# Patient Record
Sex: Male | Born: 1965 | Race: White | Hispanic: No | Smoking: Never smoker
Health system: Southern US, Community
[De-identification: ages and names within clinical notes are randomized; demographics above are authoritative.]

## PROBLEM LIST (undated history)

## (undated) DIAGNOSIS — E785 Hyperlipidemia, unspecified: Secondary | ICD-10-CM

## (undated) DIAGNOSIS — Z8042 Family history of malignant neoplasm of prostate: Secondary | ICD-10-CM

## (undated) DIAGNOSIS — Z800 Family history of malignant neoplasm of digestive organs: Secondary | ICD-10-CM

## (undated) HISTORY — DX: Hyperlipidemia, unspecified: E78.5

## (undated) HISTORY — DX: Family history of malignant neoplasm of prostate: Z80.42

## (undated) HISTORY — DX: Family history of malignant neoplasm of digestive organs: Z80.0

---

## 2004-07-30 DIAGNOSIS — Z8571 Personal history of Hodgkin lymphoma: Secondary | ICD-10-CM

## 2004-07-30 HISTORY — DX: Personal history of Hodgkin lymphoma: Z85.71

## 2005-01-23 ENCOUNTER — Ambulatory Visit: Payer: Self-pay | Admitting: Unknown Physician Specialty

## 2005-01-29 ENCOUNTER — Ambulatory Visit: Payer: Self-pay | Admitting: Oncology

## 2005-02-02 ENCOUNTER — Ambulatory Visit: Payer: Self-pay | Admitting: Oncology

## 2005-02-05 ENCOUNTER — Ambulatory Visit: Payer: Self-pay | Admitting: Unknown Physician Specialty

## 2005-02-27 ENCOUNTER — Ambulatory Visit: Payer: Self-pay | Admitting: Oncology

## 2005-03-30 ENCOUNTER — Ambulatory Visit: Payer: Self-pay | Admitting: Oncology

## 2005-04-29 ENCOUNTER — Ambulatory Visit: Payer: Self-pay | Admitting: Oncology

## 2005-05-30 ENCOUNTER — Ambulatory Visit: Payer: Self-pay | Admitting: Oncology

## 2005-06-22 ENCOUNTER — Ambulatory Visit: Payer: Self-pay | Admitting: Oncology

## 2005-06-29 ENCOUNTER — Ambulatory Visit: Payer: Self-pay | Admitting: Oncology

## 2005-07-30 ENCOUNTER — Ambulatory Visit: Payer: Self-pay | Admitting: Oncology

## 2005-08-30 ENCOUNTER — Ambulatory Visit: Payer: Self-pay | Admitting: Oncology

## 2005-09-14 ENCOUNTER — Ambulatory Visit: Payer: Self-pay | Admitting: Oncology

## 2005-09-27 ENCOUNTER — Ambulatory Visit: Payer: Self-pay | Admitting: Oncology

## 2005-12-19 ENCOUNTER — Ambulatory Visit: Payer: Self-pay | Admitting: Oncology

## 2005-12-28 ENCOUNTER — Ambulatory Visit: Payer: Self-pay | Admitting: Oncology

## 2006-03-29 ENCOUNTER — Ambulatory Visit: Payer: Self-pay | Admitting: Oncology

## 2006-04-03 ENCOUNTER — Ambulatory Visit: Payer: Self-pay | Admitting: Oncology

## 2006-04-29 ENCOUNTER — Ambulatory Visit: Payer: Self-pay | Admitting: Oncology

## 2006-08-22 ENCOUNTER — Ambulatory Visit: Payer: Self-pay | Admitting: Oncology

## 2006-08-30 ENCOUNTER — Ambulatory Visit: Payer: Self-pay | Admitting: Oncology

## 2006-11-22 ENCOUNTER — Ambulatory Visit: Payer: Self-pay | Admitting: Oncology

## 2006-11-28 ENCOUNTER — Ambulatory Visit: Payer: Self-pay | Admitting: Oncology

## 2006-12-09 ENCOUNTER — Ambulatory Visit: Payer: Self-pay | Admitting: Unknown Physician Specialty

## 2006-12-29 ENCOUNTER — Ambulatory Visit: Payer: Self-pay | Admitting: Oncology

## 2007-05-31 ENCOUNTER — Ambulatory Visit: Payer: Self-pay | Admitting: Oncology

## 2007-06-05 ENCOUNTER — Ambulatory Visit: Payer: Self-pay | Admitting: Oncology

## 2007-06-30 ENCOUNTER — Ambulatory Visit: Payer: Self-pay | Admitting: Oncology

## 2007-10-29 ENCOUNTER — Ambulatory Visit: Payer: Self-pay | Admitting: Oncology

## 2007-11-03 ENCOUNTER — Ambulatory Visit: Payer: Self-pay | Admitting: Oncology

## 2007-11-06 ENCOUNTER — Ambulatory Visit: Payer: Self-pay | Admitting: Oncology

## 2007-11-28 ENCOUNTER — Ambulatory Visit: Payer: Self-pay | Admitting: Oncology

## 2008-04-29 ENCOUNTER — Ambulatory Visit: Payer: Self-pay | Admitting: Oncology

## 2008-05-24 ENCOUNTER — Ambulatory Visit: Payer: Self-pay | Admitting: Oncology

## 2008-05-30 ENCOUNTER — Ambulatory Visit: Payer: Self-pay | Admitting: Oncology

## 2008-10-28 ENCOUNTER — Ambulatory Visit: Payer: Self-pay | Admitting: Oncology

## 2008-11-22 ENCOUNTER — Ambulatory Visit: Payer: Self-pay | Admitting: Oncology

## 2008-11-27 ENCOUNTER — Ambulatory Visit: Payer: Self-pay | Admitting: Oncology

## 2009-04-29 ENCOUNTER — Ambulatory Visit: Payer: Self-pay | Admitting: Oncology

## 2009-05-17 ENCOUNTER — Ambulatory Visit: Payer: Self-pay | Admitting: Oncology

## 2009-05-20 ENCOUNTER — Ambulatory Visit: Payer: Self-pay | Admitting: Oncology

## 2009-05-30 ENCOUNTER — Ambulatory Visit: Payer: Self-pay | Admitting: Oncology

## 2010-05-15 ENCOUNTER — Ambulatory Visit: Payer: Self-pay | Admitting: Oncology

## 2010-05-30 ENCOUNTER — Ambulatory Visit: Payer: Self-pay | Admitting: Oncology

## 2011-05-17 ENCOUNTER — Ambulatory Visit: Payer: Self-pay | Admitting: Oncology

## 2011-05-31 ENCOUNTER — Ambulatory Visit: Payer: Self-pay | Admitting: Oncology

## 2012-05-19 ENCOUNTER — Ambulatory Visit: Payer: Self-pay | Admitting: Oncology

## 2012-05-19 LAB — CBC CANCER CENTER
Eosinophil #: 0.1 x10 3/mm (ref 0.0–0.7)
Eosinophil %: 2.3 %
HCT: 43.7 % (ref 40.0–52.0)
HGB: 14.6 g/dL (ref 13.0–18.0)
Lymphocyte %: 35.3 %
MCHC: 33.3 g/dL (ref 32.0–36.0)
Monocyte %: 8.8 %
Neutrophil %: 52.9 %
Platelet: 240 x10 3/mm (ref 150–440)
RBC: 5.29 10*6/uL (ref 4.40–5.90)

## 2012-05-19 LAB — COMPREHENSIVE METABOLIC PANEL
Anion Gap: 10 (ref 7–16)
Bilirubin,Total: 0.8 mg/dL (ref 0.2–1.0)
Chloride: 102 mmol/L (ref 98–107)
Co2: 28 mmol/L (ref 21–32)
Creatinine: 1.02 mg/dL (ref 0.60–1.30)
EGFR (African American): >60
EGFR (Non-African Amer.): >60
Osmolality: 281 (ref 275–301)
Potassium: 4.3 mmol/L (ref 3.5–5.1)
SGOT(AST): 38 U/L — ABNORMAL HIGH (ref 15–37)
SGPT (ALT): 63 U/L (ref 12–78)
Total Protein: 7.6 g/dL (ref 6.4–8.2)

## 2012-05-19 LAB — SEDIMENTATION RATE: Erythrocyte Sed Rate: 1 mm/hr (ref 0–15)

## 2012-05-19 LAB — LACTATE DEHYDROGENASE: LDH: 165 U/L (ref 85–241)

## 2012-05-30 ENCOUNTER — Ambulatory Visit: Payer: Self-pay | Admitting: Oncology

## 2013-05-18 ENCOUNTER — Ambulatory Visit: Payer: Self-pay | Admitting: Oncology

## 2013-05-18 LAB — CBC CANCER CENTER
Basophil #: 0 x10 3/mm (ref 0.0–0.1)
Basophil %: 0.6 %
Eosinophil #: 0.2 x10 3/mm (ref 0.0–0.7)
Eosinophil %: 2 %
HCT: 46.1 % (ref 40.0–52.0)
HGB: 16 g/dL (ref 13.0–18.0)
Lymphocyte #: 2.5 x10 3/mm (ref 1.0–3.6)
MCH: 28 pg (ref 26.0–34.0)
MCHC: 34.7 g/dL (ref 32.0–36.0)
MCV: 81 fL (ref 80–100)
Monocyte #: 0.5 x10 3/mm (ref 0.2–1.0)
Monocyte %: 6.5 %
Platelet: 249 x10 3/mm (ref 150–440)
WBC: 7.7 x10 3/mm (ref 3.8–10.6)

## 2013-05-18 LAB — COMPREHENSIVE METABOLIC PANEL
Albumin: 4.3 g/dL (ref 3.4–5.0)
Alkaline Phosphatase: 69 U/L (ref 50–136)
BUN: 18 mg/dL (ref 7–18)
Bilirubin,Total: 0.8 mg/dL (ref 0.2–1.0)
Chloride: 102 mmol/L (ref 98–107)
Co2: 29 mmol/L (ref 21–32)
EGFR (African American): >60
Osmolality: 278 (ref 275–301)
Potassium: 4.5 mmol/L (ref 3.5–5.1)
SGOT(AST): 24 U/L (ref 15–37)
SGPT (ALT): 38 U/L (ref 12–78)
Sodium: 138 mmol/L (ref 136–145)
Total Protein: 7.8 g/dL (ref 6.4–8.2)

## 2013-05-18 LAB — LACTATE DEHYDROGENASE: LDH: 144 U/L (ref 85–241)

## 2013-05-30 ENCOUNTER — Ambulatory Visit: Payer: Self-pay | Admitting: Oncology

## 2014-05-18 ENCOUNTER — Ambulatory Visit: Payer: Self-pay | Admitting: Oncology

## 2014-05-18 LAB — COMPREHENSIVE METABOLIC PANEL
Albumin: 4.4 g/dL (ref 3.4–5.0)
Alkaline Phosphatase: 57 U/L
Anion Gap: 6 — ABNORMAL LOW (ref 7–16)
BUN: 15 mg/dL (ref 7–18)
Bilirubin,Total: 0.8 mg/dL (ref 0.2–1.0)
Calcium, Total: 9.7 mg/dL (ref 8.5–10.1)
Chloride: 102 mmol/L (ref 98–107)
Co2: 30 mmol/L (ref 21–32)
Creatinine: 1.06 mg/dL (ref 0.60–1.30)
EGFR (African American): >60
EGFR (Non-African Amer.): >60
Glucose: 92 mg/dL (ref 65–99)
Osmolality: 276 (ref 275–301)
Potassium: 4.3 mmol/L (ref 3.5–5.1)
SGOT(AST): 31 U/L (ref 15–37)
SGPT (ALT): 65 U/L — ABNORMAL HIGH
SODIUM: 138 mmol/L (ref 136–145)
TOTAL PROTEIN: 7.7 g/dL (ref 6.4–8.2)

## 2014-05-18 LAB — CBC CANCER CENTER
BASOS PCT: 0.5 %
Basophil #: 0 x10 3/mm (ref 0.0–0.1)
Eosinophil #: 0.2 x10 3/mm (ref 0.0–0.7)
Eosinophil %: 2.2 %
HCT: 45.4 % (ref 40.0–52.0)
HGB: 15 g/dL (ref 13.0–18.0)
LYMPHS PCT: 36.8 %
Lymphocyte #: 2.8 x10 3/mm (ref 1.0–3.6)
MCH: 27.3 pg (ref 26.0–34.0)
MCHC: 33 g/dL (ref 32.0–36.0)
MCV: 83 fL (ref 80–100)
MONO ABS: 0.6 x10 3/mm (ref 0.2–1.0)
Monocyte %: 7.7 %
NEUTROS PCT: 52.8 %
Neutrophil #: 4 x10 3/mm (ref 1.4–6.5)
PLATELETS: 259 x10 3/mm (ref 150–440)
RBC: 5.5 10*6/uL (ref 4.40–5.90)
RDW: 13.2 % (ref 11.5–14.5)
WBC: 7.7 x10 3/mm (ref 3.8–10.6)

## 2014-05-18 LAB — LACTATE DEHYDROGENASE: LDH: 147 U/L (ref 85–241)

## 2014-05-30 ENCOUNTER — Ambulatory Visit: Payer: Self-pay | Admitting: Oncology

## 2015-01-17 ENCOUNTER — Other Ambulatory Visit: Payer: Self-pay | Admitting: Physician Assistant

## 2015-01-17 ENCOUNTER — Ambulatory Visit
Admission: RE | Admit: 2015-01-17 | Discharge: 2015-01-17 | Disposition: A | Discharge status: Home / Self Care | Payer: BLUE CROSS/BLUE SHIELD | Source: Ambulatory Visit | Attending: Physician Assistant | Admitting: Physician Assistant

## 2015-01-17 DIAGNOSIS — R05 Cough: Secondary | ICD-10-CM

## 2015-01-17 DIAGNOSIS — R0602 Shortness of breath: Secondary | ICD-10-CM | POA: Diagnosis present

## 2015-01-17 DIAGNOSIS — R059 Cough, unspecified: Secondary | ICD-10-CM

## 2015-01-17 DIAGNOSIS — J9811 Atelectasis: Secondary | ICD-10-CM | POA: Diagnosis not present

## 2015-01-19 ENCOUNTER — Ambulatory Visit
Admission: RE | Admit: 2015-01-19 | Discharge: 2015-01-19 | Disposition: A | Discharge status: Home / Self Care | Payer: BLUE CROSS/BLUE SHIELD | Source: Ambulatory Visit | Attending: Physician Assistant | Admitting: Physician Assistant

## 2015-01-19 DIAGNOSIS — N200 Calculus of kidney: Secondary | ICD-10-CM | POA: Insufficient documentation

## 2015-01-19 DIAGNOSIS — J9811 Atelectasis: Secondary | ICD-10-CM | POA: Diagnosis present

## 2015-05-16 ENCOUNTER — Other Ambulatory Visit: Payer: Self-pay | Admitting: *Deleted

## 2015-05-16 DIAGNOSIS — C8190 Hodgkin lymphoma, unspecified, unspecified site: Secondary | ICD-10-CM

## 2015-05-19 ENCOUNTER — Inpatient Hospital Stay (HOSPITAL_BASED_OUTPATIENT_CLINIC_OR_DEPARTMENT_OTHER): Payer: BLUE CROSS/BLUE SHIELD | Admitting: Oncology

## 2015-05-19 ENCOUNTER — Inpatient Hospital Stay: Payer: BLUE CROSS/BLUE SHIELD | Attending: Oncology

## 2015-05-19 VITALS — BP 129/86 | HR 82 | Temp 96.5°F | Wt 196.4 lb

## 2015-05-19 DIAGNOSIS — F419 Anxiety disorder, unspecified: Secondary | ICD-10-CM | POA: Diagnosis not present

## 2015-05-19 DIAGNOSIS — Z79899 Other long term (current) drug therapy: Secondary | ICD-10-CM

## 2015-05-19 DIAGNOSIS — R748 Abnormal levels of other serum enzymes: Secondary | ICD-10-CM

## 2015-05-19 DIAGNOSIS — C8112 Nodular sclerosis classical Hodgkin lymphoma, intrathoracic lymph nodes: Secondary | ICD-10-CM | POA: Insufficient documentation

## 2015-05-19 DIAGNOSIS — C8190 Hodgkin lymphoma, unspecified, unspecified site: Secondary | ICD-10-CM

## 2015-05-19 DIAGNOSIS — Z800 Family history of malignant neoplasm of digestive organs: Secondary | ICD-10-CM | POA: Diagnosis not present

## 2015-05-19 DIAGNOSIS — Z9221 Personal history of antineoplastic chemotherapy: Secondary | ICD-10-CM

## 2015-05-19 LAB — CBC WITH DIFFERENTIAL/PLATELET
Basophils Absolute: 0 10*3/uL (ref 0–0.1)
Basophils Relative: 1 %
EOS ABS: 0.2 10*3/uL (ref 0–0.7)
EOS PCT: 2 %
HCT: 44.6 % (ref 40.0–52.0)
HEMOGLOBIN: 15.2 g/dL (ref 13.0–18.0)
LYMPHS ABS: 2 10*3/uL (ref 1.0–3.6)
Lymphocytes Relative: 28 %
MCH: 27.4 pg (ref 26.0–34.0)
MCHC: 34 g/dL (ref 32.0–36.0)
MCV: 80.5 fL (ref 80.0–100.0)
MONOS PCT: 8 %
Monocytes Absolute: 0.6 10*3/uL (ref 0.2–1.0)
Neutro Abs: 4.4 10*3/uL (ref 1.4–6.5)
Neutrophils Relative %: 61 %
PLATELETS: 250 10*3/uL (ref 150–440)
RBC: 5.54 MIL/uL (ref 4.40–5.90)
RDW: 13.6 % (ref 11.5–14.5)
WBC: 7.2 10*3/uL (ref 3.8–10.6)

## 2015-05-19 LAB — COMPREHENSIVE METABOLIC PANEL
ALT: 36 U/L (ref 17–63)
ANION GAP: 4 — AB (ref 5–15)
AST: 29 U/L (ref 15–41)
Albumin: 4.5 g/dL (ref 3.5–5.0)
Alkaline Phosphatase: 55 U/L (ref 38–126)
BUN: 23 mg/dL — ABNORMAL HIGH (ref 6–20)
CHLORIDE: 104 mmol/L (ref 101–111)
CO2: 27 mmol/L (ref 22–32)
Calcium: 8.6 mg/dL — ABNORMAL LOW (ref 8.9–10.3)
Creatinine, Ser: 1.04 mg/dL (ref 0.61–1.24)
GFR calc non Af Amer: >60 mL/min (ref >60)
GLUCOSE: 93 mg/dL (ref 65–99)
POTASSIUM: 4.1 mmol/L (ref 3.5–5.1)
Sodium: 135 mmol/L (ref 135–145)
Total Bilirubin: 1 mg/dL (ref 0.3–1.2)
Total Protein: 7.6 g/dL (ref 6.5–8.1)

## 2015-05-19 LAB — LACTATE DEHYDROGENASE: LDH: 129 U/L (ref 98–192)

## 2015-05-20 ENCOUNTER — Encounter: Payer: Self-pay | Admitting: Oncology

## 2015-05-21 ENCOUNTER — Encounter: Payer: Self-pay | Admitting: Oncology

## 2015-05-21 NOTE — Progress Notes (Signed)
Cancer Center Progress Note  [Authored: 20-Oct-15 11:43]- for Visit: [id number], Complete, Entered, Signed in Full, General  HPI: Referred by PCP, Dr. 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 Stage II based on CT scan and bone marrow aspiration and biopsy and a PET scan. Stage IIA. HPI:   86 year old gentleman with a previous history of Hodgkin's disease status post chemotherapy.  Diagnosis approximately 10 years ago.  No chills.  No fever. Here for  further follow-up and treatment consideration   Review of Systems:  General: denies complaints  Performance Status (ECOG): 0  HEENT: no complaints  Lungs: no complaints  Cardiac: no complaints  GI: no complaints  GU: no complaints  Musculoskeletal: no complaints  Extremities: no complaints  Skin: no complaints  Neuro: no complaints  Endocrine: no complaints  Psych: anxiety  Pain ?: No complaints (0, none)  Fertility Preservation: Infertility risk discussed  Review of Systems: All other systems were reviewed and found to be negative   Allergies:  No Known Allergies:   PFSH: Additional Past Medical and Surgical History: Past Medical History:  No significant past medical history.    Past Surgical History:  No significant past surgical history.    Family History:  History of colon cancer in the family. No history of diabetes or hypertension in the family.    Social History:  Does not smoke. Does not drink. Does not use any recreational drugs   Home Medications: Medication Instructions Last Modified Date/Time  multivitamin   once a day  20-Oct-15 10:53  Claritin 10 mg oral tablet   once a day  20-Oct-15 10:53  Osteo Bi-Flex 250 mg-200 mg oral tablet  orally once a day 20-Oct-15 10:53   Red Yeast Rice 600 mg oral capsule 2 cap(s) orally once a day 20-Oct-15 10:53     Physical Exam:  General: well developed, well nourished, and no acute distress  Mental Status: alert and oriented to person, place and time  Head, Ears, Nose,Throat: normal, no lesions or deformities  Neck, Thyroid: no thyroid tenderness, enlargement or nodule.  neck supple without massess or tenderness. no adenopathy.  no carotid bruit.  Respiratory: lungs: Air entry equal on both sides   No rhonchi.  No crepitation   No  dullness on percussion    No tenderness  Cardiovascular: regular rate and rhythm, no murmur, rub or gallop  Gastrointestinal: soft, non tender, no masses and normal bowel sounds  Musculoskeletal: no muscular asymmetry noted.  no swelling or tenderness of joints.  ROM upper and lower extremities normal  Skin: no rashes, ulcers, or lesions  Neurological: normal motor exam, gait, 5 x 5, strength, grip, pressure, flexor, extensor  Lymphatics: no cervical, axillary, or inguinal lymphadenopathy    =======================================================================     Assessment and Plan: Impression:   1. Hodgkin's disease.  Plan:   1. Hodgkin's disease. On clinical ground there is no evidence of recurrent disease. All lab data has been reviewed and is been reported to be normal.  Patient is now almost 10 years from diagnosis and patient will be discharged from our care to be followed by primary care physician   on clinical ground there is no evidence of recurrent or progressive disease. Liver enzymes are slightly elevated and can be followed. patient  is going  to get a flu shot at the place of his work  Patient will get flu shot With primary care physician  I will be happy to see this patient again if there is any abnormality detected on physical exam or lab during follow-up. Patient will be followed by [employer] physician

## 2016-05-30 IMAGING — CT CT CHEST W/O CM
2 of 3 series · 15 of 36 positions shown, 18 images · non-contrast
Comparison: Multiple exams, including 01/17/2015 and 05/17/2009

CLINICAL DATA: Hodgkin' s lymphoma diagnosed in 7864 with
chemotherapy. Cough and congestion starting [REDACTED]. Possible
right hilar adenopathy with atelectasis on chest radiography.

EXAM:
CT CHEST WITHOUT CONTRAST
TECHNIQUE: Multidetector CT imaging of the chest was performed following the
standard protocol without IV contrast.

[Series 2: routine chest wo · axial · 0.71mm/px · z∈[-837,-572]mm · 12 of 63 slices shown, 15 images]
[im 5/63  mediastinal]
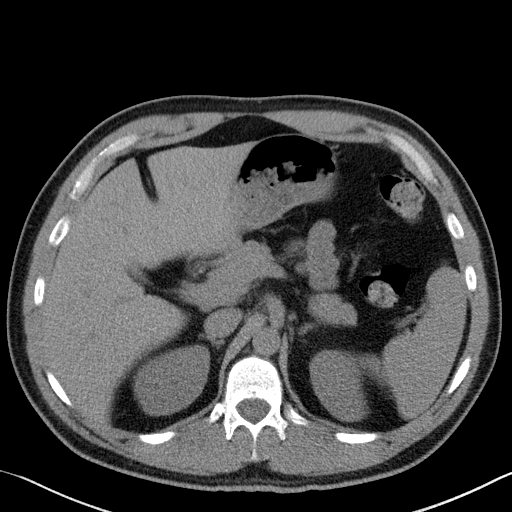
[im 5/63  lung]
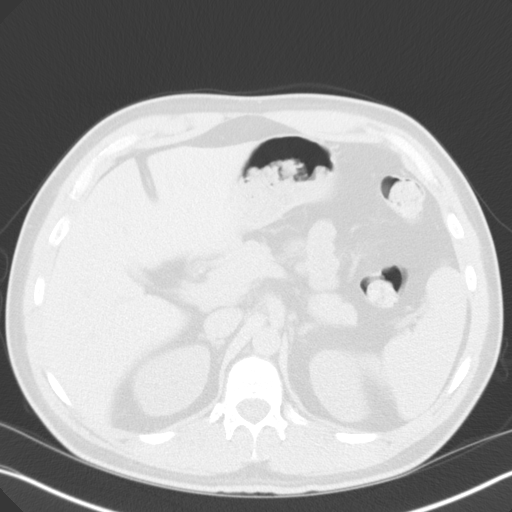
[im 10/63  lung]
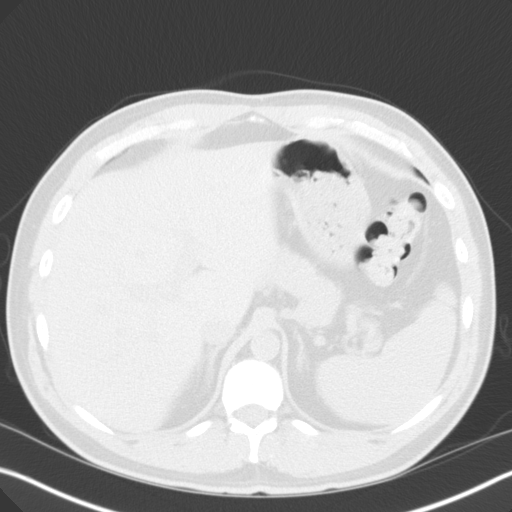
[im 14/63  lung]
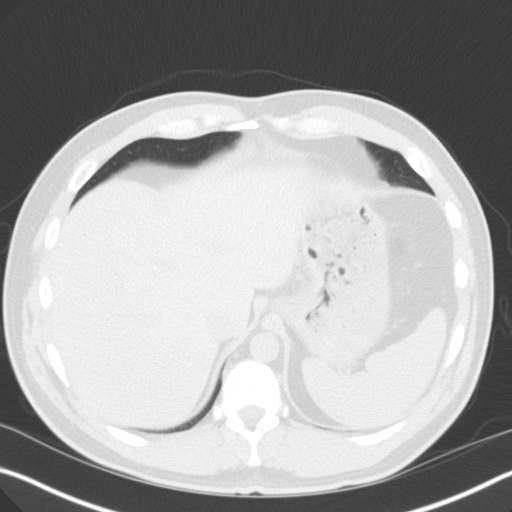
[im 19/63  lung]
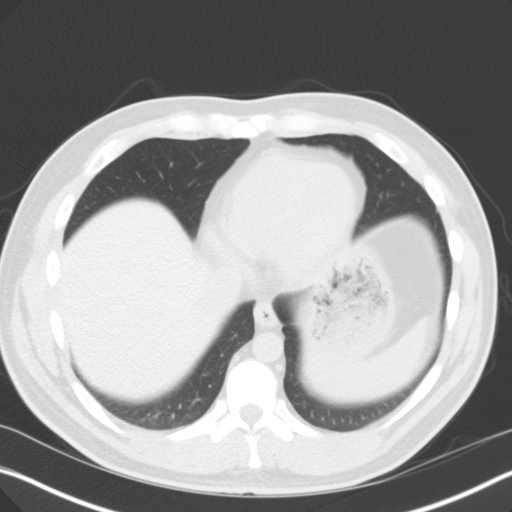
[im 23/63  mediastinal]
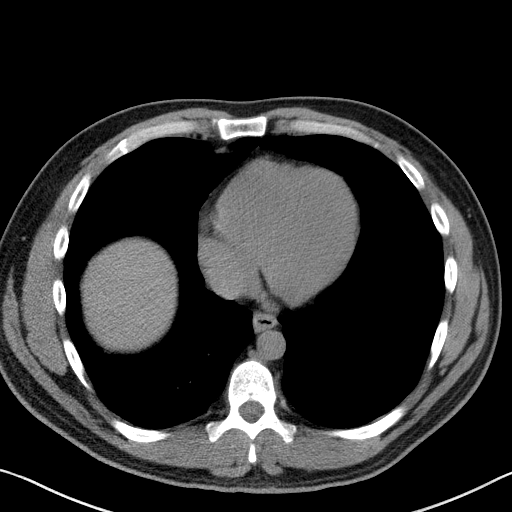
[im 23/63  lung]
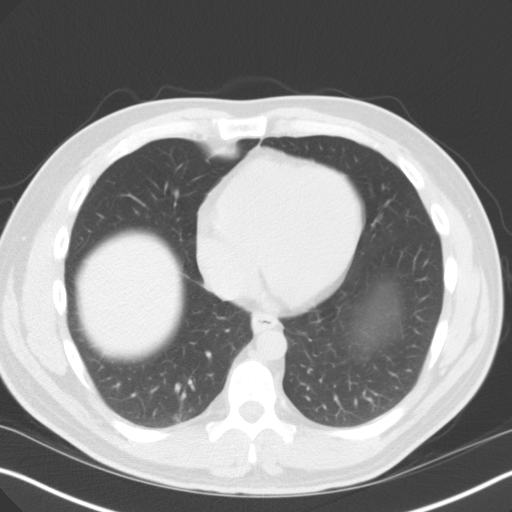
[im 28/63  lung]
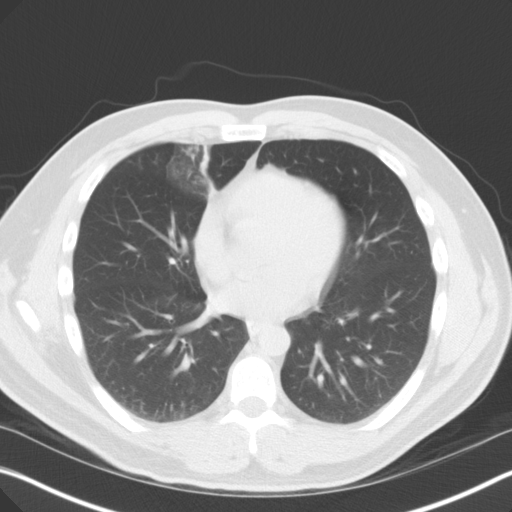
[im 35/63  lung]
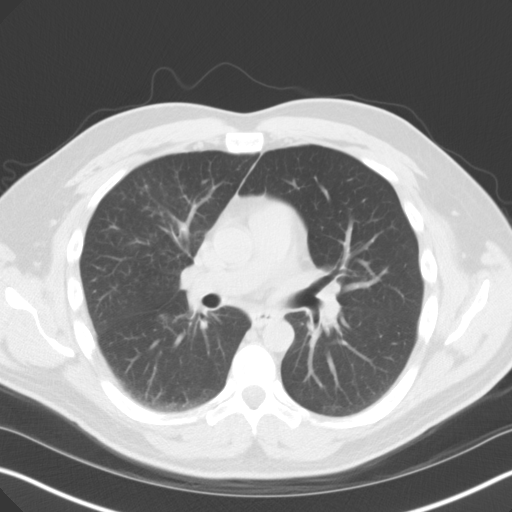
[im 40/63  lung]
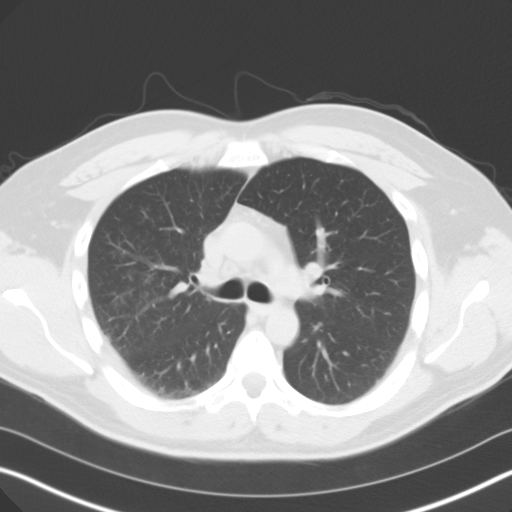
[im 44/63  mediastinal]
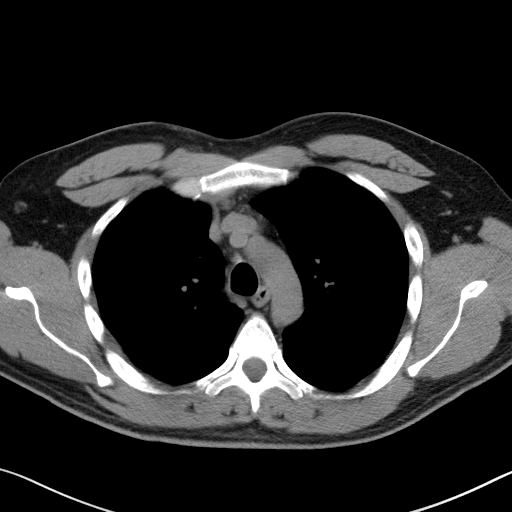
[im 44/63  lung]
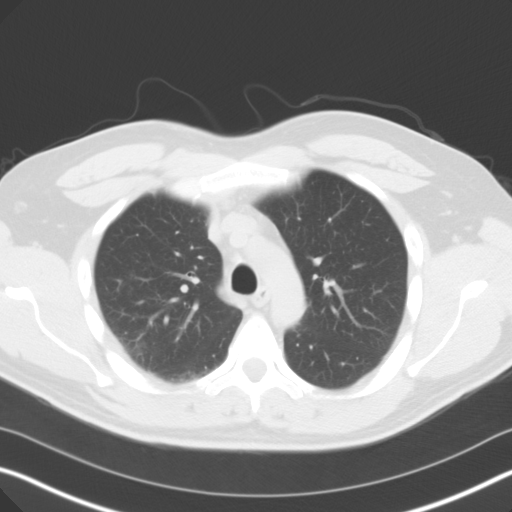
[im 49/63  lung]
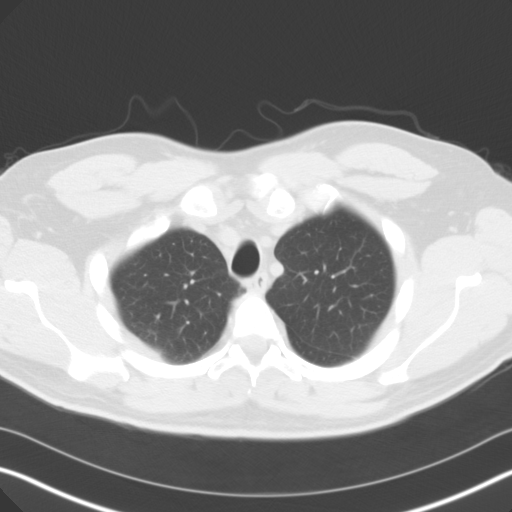
[im 53/63  lung]
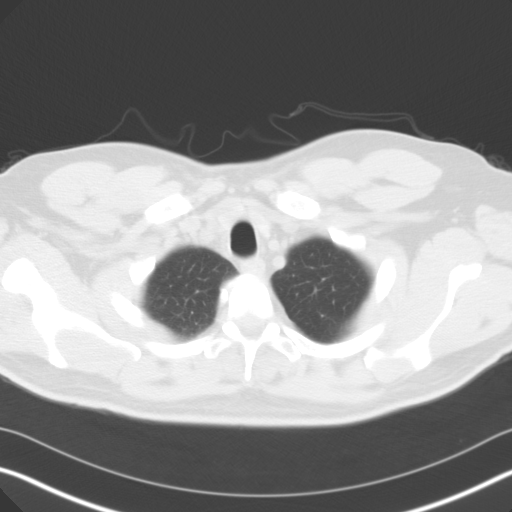
[im 58/63  lung]
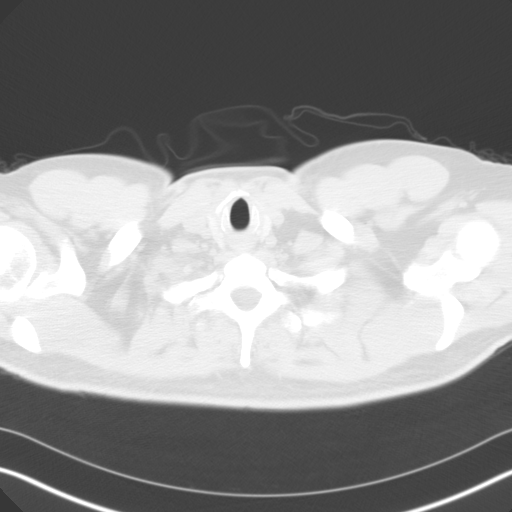

[Series 5: cor routine chest wo · coronal · 0.62mm/px · 3 of 141 slices shown]
[im 29/141  lung]
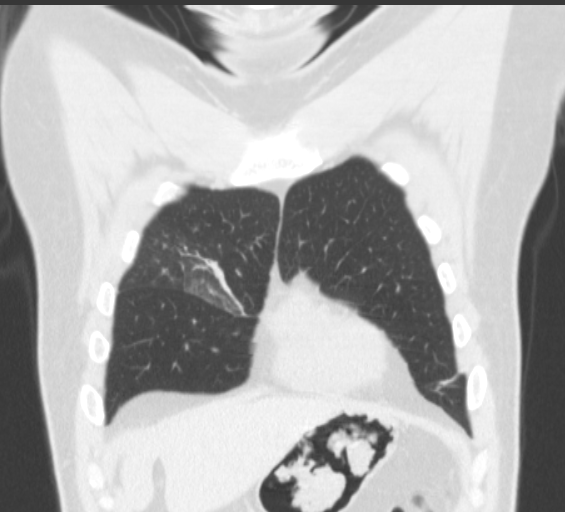
[im 57/141  lung]
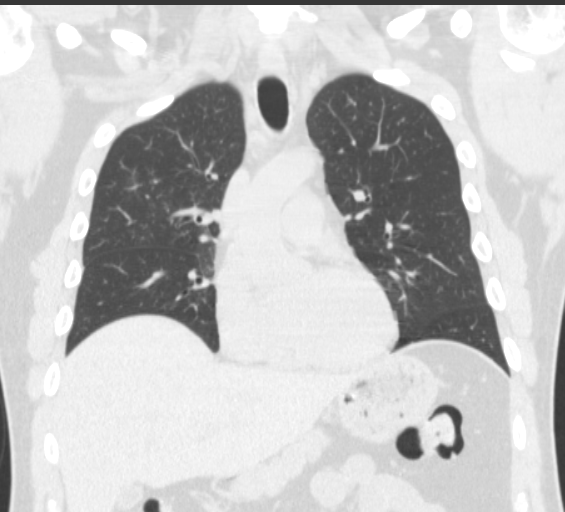
[im 85/141  lung]
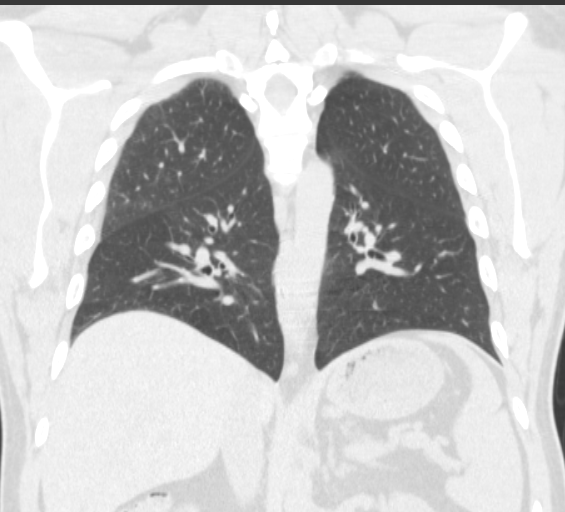

[15 of 36 positions shown; findings below may reference images not displayed]

FINDINGS: Mediastinum/Nodes: Although it is difficult separate lymph nodes
from pulmonary vasculature at the hila due to the lack of IV
contrast, I do not see overtly enlarged lymph nodes. Prominent right
lower paratracheal lymph node but this has a fatty hilum and is
probably benign. I suspect that the appearance of hilar prominence
on chest radiography was due to the atelectasis itself rather than
true adenopathy.

Lungs/Pleura: Band of atelectasis or scarring anteriorly in the
right upper lobe just above the minor fissure. There is also some
adjacent ground-glass opacity tracking along the atelectatic lung.
Subsegmental atelectasis is present dependently in the right lower
lobe.

No specific worrisome pulmonary nodule is identified.

Upper abdomen: 2 mm left kidney upper pole nonobstructive calculus.

Musculoskeletal: Unremarkable
IMPRESSION: 1. Atelectasis or scarring in anteriorly in the right upper lobe
with some adjacent ground-glass opacity which could be due to
alveolitis. I do not see a definite associated endobronchial lesion.
There may be some mild cylindrical bronchiectasis in the involved
segment of right upper lobe. The appearance is new compared to
05/17/2009. Overall appearance favors an
inflammatory/postinflammatory etiology although followup chest
radiography would be suggested to ensure lack of progression.
2. 2 mm left kidney upper pole nonobstructive calculus.
3. No adenopathy identified.

## 2016-07-30 HISTORY — PX: COLONOSCOPY: SHX174

## 2019-02-02 ENCOUNTER — Other Ambulatory Visit (HOSPITAL_COMMUNITY): Payer: Self-pay | Admitting: Internal Medicine

## 2019-02-02 ENCOUNTER — Other Ambulatory Visit: Payer: Self-pay | Admitting: Internal Medicine

## 2019-02-02 DIAGNOSIS — J849 Interstitial pulmonary disease, unspecified: Secondary | ICD-10-CM

## 2019-02-02 DIAGNOSIS — E7800 Pure hypercholesterolemia, unspecified: Secondary | ICD-10-CM | POA: Insufficient documentation

## 2019-02-09 ENCOUNTER — Other Ambulatory Visit: Payer: Self-pay

## 2019-02-09 ENCOUNTER — Ambulatory Visit
Admission: RE | Admit: 2019-02-09 | Discharge: 2019-02-09 | Disposition: A | Discharge status: Home / Self Care | Payer: 59 | Source: Ambulatory Visit | Attending: Internal Medicine | Admitting: Internal Medicine

## 2019-02-09 DIAGNOSIS — J849 Interstitial pulmonary disease, unspecified: Secondary | ICD-10-CM | POA: Insufficient documentation

## 2019-05-04 ENCOUNTER — Other Ambulatory Visit: Payer: Self-pay

## 2019-05-04 ENCOUNTER — Ambulatory Visit: Payer: Self-pay

## 2019-05-04 DIAGNOSIS — Z23 Encounter for immunization: Secondary | ICD-10-CM

## 2020-03-23 DIAGNOSIS — J309 Allergic rhinitis, unspecified: Secondary | ICD-10-CM | POA: Insufficient documentation

## 2020-06-28 DIAGNOSIS — Z79899 Other long term (current) drug therapy: Secondary | ICD-10-CM | POA: Diagnosis not present

## 2020-06-28 DIAGNOSIS — E7800 Pure hypercholesterolemia, unspecified: Secondary | ICD-10-CM | POA: Diagnosis not present

## 2020-06-28 DIAGNOSIS — Z125 Encounter for screening for malignant neoplasm of prostate: Secondary | ICD-10-CM | POA: Diagnosis not present

## 2020-07-05 DIAGNOSIS — Z79899 Other long term (current) drug therapy: Secondary | ICD-10-CM | POA: Diagnosis not present

## 2020-07-05 DIAGNOSIS — E7800 Pure hypercholesterolemia, unspecified: Secondary | ICD-10-CM | POA: Diagnosis not present

## 2020-07-05 DIAGNOSIS — Z0000 Encounter for general adult medical examination without abnormal findings: Secondary | ICD-10-CM | POA: Diagnosis not present

## 2020-10-03 DIAGNOSIS — Z79899 Other long term (current) drug therapy: Secondary | ICD-10-CM | POA: Diagnosis not present

## 2021-06-29 DIAGNOSIS — E7800 Pure hypercholesterolemia, unspecified: Secondary | ICD-10-CM | POA: Diagnosis not present

## 2021-06-29 DIAGNOSIS — Z125 Encounter for screening for malignant neoplasm of prostate: Secondary | ICD-10-CM | POA: Diagnosis not present

## 2021-06-29 DIAGNOSIS — Z79899 Other long term (current) drug therapy: Secondary | ICD-10-CM | POA: Diagnosis not present

## 2021-07-06 DIAGNOSIS — Z0000 Encounter for general adult medical examination without abnormal findings: Secondary | ICD-10-CM | POA: Diagnosis not present

## 2021-07-06 DIAGNOSIS — E7800 Pure hypercholesterolemia, unspecified: Secondary | ICD-10-CM | POA: Diagnosis not present

## 2021-07-06 DIAGNOSIS — I10 Essential (primary) hypertension: Secondary | ICD-10-CM | POA: Insufficient documentation

## 2021-07-06 DIAGNOSIS — Z79899 Other long term (current) drug therapy: Secondary | ICD-10-CM | POA: Diagnosis not present

## 2022-07-02 DIAGNOSIS — Z79899 Other long term (current) drug therapy: Secondary | ICD-10-CM | POA: Diagnosis not present

## 2022-07-02 DIAGNOSIS — Z125 Encounter for screening for malignant neoplasm of prostate: Secondary | ICD-10-CM | POA: Diagnosis not present

## 2022-07-02 DIAGNOSIS — E7800 Pure hypercholesterolemia, unspecified: Secondary | ICD-10-CM | POA: Diagnosis not present

## 2022-07-09 DIAGNOSIS — E7800 Pure hypercholesterolemia, unspecified: Secondary | ICD-10-CM | POA: Diagnosis not present

## 2022-07-09 DIAGNOSIS — Z0000 Encounter for general adult medical examination without abnormal findings: Secondary | ICD-10-CM | POA: Diagnosis not present

## 2022-07-09 DIAGNOSIS — Z125 Encounter for screening for malignant neoplasm of prostate: Secondary | ICD-10-CM | POA: Diagnosis not present

## 2022-07-09 DIAGNOSIS — I10 Essential (primary) hypertension: Secondary | ICD-10-CM | POA: Diagnosis not present

## 2022-08-02 DIAGNOSIS — Z125 Encounter for screening for malignant neoplasm of prostate: Secondary | ICD-10-CM | POA: Diagnosis not present

## 2022-08-08 ENCOUNTER — Encounter
Admission: RE | Disposition: A | Discharge status: Home / Self Care | Payer: Self-pay | Source: Home / Self Care | Attending: Internal Medicine

## 2022-08-08 ENCOUNTER — Ambulatory Visit
Admission: RE | Admit: 2022-08-08 | Discharge: 2022-08-08 | Disposition: A | Discharge status: Home / Self Care | Payer: 59 | Attending: Internal Medicine | Admitting: Internal Medicine

## 2022-08-08 ENCOUNTER — Ambulatory Visit: Payer: 59 | Admitting: Anesthesiology

## 2022-08-08 DIAGNOSIS — K640 First degree hemorrhoids: Secondary | ICD-10-CM | POA: Diagnosis not present

## 2022-08-08 DIAGNOSIS — Z83719 Family history of colon polyps, unspecified: Secondary | ICD-10-CM | POA: Insufficient documentation

## 2022-08-08 DIAGNOSIS — K6389 Other specified diseases of intestine: Secondary | ICD-10-CM | POA: Diagnosis not present

## 2022-08-08 DIAGNOSIS — E785 Hyperlipidemia, unspecified: Secondary | ICD-10-CM | POA: Diagnosis not present

## 2022-08-08 DIAGNOSIS — Z1211 Encounter for screening for malignant neoplasm of colon: Secondary | ICD-10-CM | POA: Insufficient documentation

## 2022-08-08 DIAGNOSIS — I10 Essential (primary) hypertension: Secondary | ICD-10-CM | POA: Diagnosis not present

## 2022-08-08 DIAGNOSIS — K648 Other hemorrhoids: Secondary | ICD-10-CM | POA: Diagnosis not present

## 2022-08-08 DIAGNOSIS — Z8571 Personal history of Hodgkin lymphoma: Secondary | ICD-10-CM | POA: Diagnosis not present

## 2022-08-08 DIAGNOSIS — Z800 Family history of malignant neoplasm of digestive organs: Secondary | ICD-10-CM | POA: Insufficient documentation

## 2022-08-08 HISTORY — PX: COLONOSCOPY WITH PROPOFOL: SHX5780

## 2022-08-08 SURGERY — COLONOSCOPY WITH PROPOFOL
Anesthesia: General

## 2022-08-08 MED ORDER — SODIUM CHLORIDE 0.9 % IV SOLN
INTRAVENOUS | Status: DC
  Administered 2022-08-08: 20 mL/h via INTRAVENOUS

## 2022-08-08 MED ORDER — MIDAZOLAM HCL 2 MG/2ML IJ SOLN
INTRAMUSCULAR | Status: AC
  Filled 2022-08-08: qty 2

## 2022-08-08 MED ORDER — FENTANYL CITRATE (PF) 100 MCG/2ML IJ SOLN
INTRAMUSCULAR | Status: DC | PRN
  Administered 2022-08-08 (×4): 25 ug via INTRAVENOUS

## 2022-08-08 MED ORDER — ONDANSETRON HCL 4 MG/2ML IJ SOLN
INTRAMUSCULAR | Status: DC | PRN
  Administered 2022-08-08: 4 mg via INTRAVENOUS

## 2022-08-08 MED ORDER — PROPOFOL 500 MG/50ML IV EMUL
INTRAVENOUS | Status: DC | PRN
  Administered 2022-08-08: 100 ug/kg/min via INTRAVENOUS

## 2022-08-08 MED ORDER — MIDAZOLAM HCL 2 MG/2ML IJ SOLN
INTRAMUSCULAR | Status: DC | PRN
  Administered 2022-08-08: 2 mg via INTRAVENOUS

## 2022-08-08 MED ORDER — FENTANYL CITRATE (PF) 100 MCG/2ML IJ SOLN
INTRAMUSCULAR | Status: AC
  Filled 2022-08-08: qty 2

## 2022-08-08 MED ORDER — PROPOFOL 10 MG/ML IV BOLUS
INTRAVENOUS | Status: DC | PRN
  Administered 2022-08-08: 80 mg via INTRAVENOUS

## 2022-08-08 NOTE — Anesthesia Postprocedure Evaluation (Signed)
Anesthesia Post Note  Patient: Scott Cardenas  Procedure(s) Performed: COLONOSCOPY WITH PROPOFOL  Patient location during evaluation: Endoscopy Anesthesia Type: General Level of consciousness: awake and alert Pain management: pain level controlled Vital Signs Assessment: post-procedure vital signs reviewed and stable Respiratory status: spontaneous breathing, nonlabored ventilation, respiratory function stable and patient connected to nasal cannula oxygen Cardiovascular status: blood pressure returned to baseline and stable Postop Assessment: no apparent nausea or vomiting Anesthetic complications: no  No notable events documented.   Last Vitals:  Vitals:   08/08/22 0950 08/08/22 0955  BP: (!) 99/55 108/62  Pulse: 73 80  Resp: 13 11  Temp:    SpO2: 98% 95%    Last Pain:  Vitals:   08/08/22 0955  TempSrc:   PainSc: 0-No pain                 Ilene Qua

## 2022-08-08 NOTE — Interval H&P Note (Signed)
History and Physical Interval Note:  08/08/2022 8:39 AM  Scott Cardenas  has presented today for surgery, with the diagnosis of Z12.11 - Colon cancer screening Z83.719  - Family history of colonic polyps.  The various methods of treatment have been discussed with the patient and family. After consideration of risks, benefits and other options for treatment, the patient has consented to  Procedure(s): COLONOSCOPY WITH PROPOFOL (N/A) as a surgical intervention.  The patient's history has been reviewed, patient examined, no change in status, stable for surgery.  I have reviewed the patient's chart and labs.  Questions were answered to the patient's satisfaction.     Scott Cardenas, [place]

## 2022-08-08 NOTE — Transfer of Care (Signed)
Immediate Anesthesia Transfer of Care Note  Patient: Scott Cardenas  Procedure(s) Performed: COLONOSCOPY WITH PROPOFOL  Patient Location: PACU  Anesthesia Type:General  Level of Consciousness: drowsy  Airway & Oxygen Therapy: Patient Spontanous Breathing and Patient connected to nasal cannula oxygen  Post-op Assessment: Report given to RN, Post -op Vital signs reviewed and stable, and Patient moving all extremities  Post vital signs: Reviewed and stable  Last Vitals:  Vitals Value Taken Time  BP 96/48 08/08/22 0935  Temp    Pulse 71 08/08/22 0935  Resp 13 08/08/22 0935  SpO2 95 % 08/08/22 0935  Vitals shown include unvalidated device data.  Last Pain:  Vitals:   08/08/22 0816  TempSrc: Temporal  PainSc: 0-No pain         Complications: No notable events documented.

## 2022-08-08 NOTE — H&P (Signed)
  Outpatient short stay form Pre-procedure 08/08/2022 8:38 AM Sharra Cayabyab K. Alice Reichert, M.D.  Primary Physician: Fulton Reek, M.D.  Reason for visit:  Colon cancer screening  History of present illness:  Patient presents for colonoscopy for colon cancer screening. The patient denies complaints of abdominal pain, significant change in bowel habits, or rectal bleeding.      Current Facility-Administered Medications:    0.9 %  sodium chloride infusion, , Intravenous, Continuous, [place], Benay Pike, MD, Last Rate: 20 mL/hr at 08/08/22 0829, 20 mL/hr at 08/08/22 6967  Medications Prior to Admission  Medication Sig Dispense Refill Last Dose   simvastatin (ZOCOR) 20 MG tablet Take 20 mg by mouth daily.  4 Past Week     No Known Allergies   Past Medical History:  Diagnosis Date   Family history of colon cancer    Family history of prostate cancer    History of Hodgkin's disease 2006   Hyperlipidemia     Review of systems:  Otherwise negative.    Physical Exam  Gen: Alert, oriented. Appears stated age.  HEENT: [place]/AT. PERRLA. Lungs: CTA, no wheezes. CV: RR nl S1, S2. Abd: soft, benign, no masses. BS+ Ext: No edema. Pulses 2+    Planned procedures: Proceed with colonoscopy. The patient understands the nature of the planned procedure, indications, risks, alternatives and potential complications including but not limited to bleeding, infection, perforation, damage to internal organs and possible oversedation/side effects from anesthesia. The patient agrees and gives consent to proceed.  Please refer to procedure notes for findings, recommendations and patient disposition/instructions.     [place] Kirchner K. Alice Reichert, M.D. Gastroenterology 08/08/2022  8:38 AM

## 2022-08-08 NOTE — Anesthesia Preprocedure Evaluation (Addendum)
Anesthesia Evaluation  Patient identified by MRN, date of birth, ID band Patient awake    Reviewed: Allergy & Precautions, NPO status , Patient's Chart, lab work & pertinent test results  History of Anesthesia Complications Negative for: history of anesthetic complications  Airway Mallampati: II  TM Distance: >3 FB Neck ROM: full    Dental no notable dental hx.    Pulmonary neg pulmonary ROS   Pulmonary exam normal        Cardiovascular hypertension, On Medications Normal cardiovascular exam     Neuro/Psych negative neurological ROS  negative psych ROS   GI/Hepatic negative GI ROS, Neg liver ROS,,,  Endo/Other  negative endocrine ROS    Renal/GU negative Renal ROS  negative genitourinary   Musculoskeletal   Abdominal   Peds  Hematology negative hematology ROS (+)   Anesthesia Other Findings Past Medical History: No date: Family history of colon cancer No date: Family history of prostate cancer 2006: History of Hodgkin's disease No date: Hyperlipidemia  BMI    Body Mass Index: 27.83 kg/m      Reproductive/Obstetrics negative OB ROS                              Anesthesia Physical Anesthesia Plan  ASA: 2  Anesthesia Plan: General ETT   Post-op Pain Management: Minimal or no pain anticipated   Induction:   PONV Risk Score and Plan: Ondansetron, Dexamethasone, Midazolam and Treatment may vary due to age or medical condition  Airway Management Planned:   Additional Equipment:   Intra-op Plan:   Post-operative Plan:   Informed Consent: I have reviewed the patients History and Physical, chart, labs and discussed the procedure including the risks, benefits and alternatives for the proposed anesthesia with the patient or authorized representative who has indicated his/her understanding and acceptance.     Dental Advisory Given  Plan Discussed with: Anesthesiologist,  CRNA and Surgeon  Anesthesia Plan Comments:         Anesthesia Quick Evaluation

## 2022-08-08 NOTE — Op Note (Signed)
[facility] Gastroenterology Patient Name: Scott Cardenas Procedure Date: 08/08/2022 9:16 AM MRN: [id number] Account #: [id number] Date of Birth: 1965-10-30 Admit Type: Outpatient Age: 57 Room: [facility] ENDO ROOM 2 Gender: Male Note Status: Finalized Instrument Name: Jasper Riling [id number] Procedure:             Colonoscopy Indications:           Colon cancer screening in patient at increased risk:                         Family history of 1st-degree relative with colon polyps Providers:             Benay Pike. Alice Reichert MD, MD Referring MD:          Leonie Douglas. Doy Hutching, MD (Referring MD) Medicines:             Propofol per Anesthesia Complications:         No immediate complications. Procedure:             Pre-Anesthesia Assessment:                        - The risks and benefits of the procedure and the                         sedation options and risks were discussed with the                         patient. All questions were answered and informed                         consent was obtained.                        - Patient identification and proposed procedure were                         verified prior to the procedure by the nurse. The                         procedure was verified in the procedure room.                        - ASA Grade Assessment: II - A patient with mild                         systemic disease.                        - After reviewing the risks and benefits, the patient                         was deemed in satisfactory condition to undergo the                         procedure.                        After obtaining informed consent, the colonoscope was  passed under direct vision. Throughout the procedure,                         the patient's blood pressure, pulse, and oxygen                         saturations were monitored continuously. The                         Colonoscope was introduced through the anus and                          advanced to the the cecum, identified by appendiceal                         orifice and ileocecal valve. The colonoscopy was                         performed without difficulty. The patient tolerated                         the procedure well. The quality of the bowel                         preparation was good. The ileocecal valve, appendiceal                         orifice, and rectum were photographed. Findings:      The perianal and digital rectal examinations were normal. Pertinent       negatives include normal sphincter tone and no palpable rectal lesions.      Non-bleeding internal hemorrhoids were found during retroflexion. The       hemorrhoids were Grade I (internal hemorrhoids that do not prolapse).      The ileocecal valve was slightly lipomatous. Biopsies were taken with a       cold forceps for histology.      The exam was otherwise without abnormality. Impression:            - Non-bleeding internal hemorrhoids.                        - Lipomatous ileocecal valve. Biopsied.                        - The examination was otherwise normal. Recommendation:        - Patient has a contact number available for                         emergencies. The signs and symptoms of potential                         delayed complications were discussed with the patient.                         Return to normal activities tomorrow. Written                         discharge instructions were provided to the patient.                        -  Resume previous diet.                        - Continue present medications.                        - Await pathology results.                        - Repeat colonoscopy in 5 years for screening purposes.                        - Return to GI office PRN.                        - The findings and recommendations were discussed with                         the patient. Procedure Code(s):     --- Professional ---                         [phone], Colonoscopy, flexible; with biopsy, single or                         multiple Diagnosis Code(s):     --- Professional ---                        K64.0, First degree hemorrhoids                        K63.89, Other specified diseases of intestine                        Z83.71, Family history of colonic polyps CPT copyright 2022 [employer]. All rights reserved. The codes documented in this report are preliminary and upon coder review may  be revised to meet current compliance requirements. Efrain Sella MD, MD 08/08/2022 9:35:42 AM This report has been signed electronically. Number of Addenda: 0 Note Initiated On: 08/08/2022 9:16 AM Scope Withdrawal Time: 0 hours 5 minutes 41 seconds  Total Procedure Duration: 0 hours 8 minutes 28 seconds  Estimated Blood Loss:  Estimated blood loss: none.      [facility]

## 2022-08-09 ENCOUNTER — Encounter: Payer: Self-pay | Admitting: Internal Medicine

## 2022-08-09 LAB — SURGICAL PATHOLOGY

## 2022-10-31 DIAGNOSIS — H524 Presbyopia: Secondary | ICD-10-CM | POA: Diagnosis not present

## 2023-03-15 DIAGNOSIS — Z79899 Other long term (current) drug therapy: Secondary | ICD-10-CM | POA: Diagnosis not present

## 2023-03-15 DIAGNOSIS — Z125 Encounter for screening for malignant neoplasm of prostate: Secondary | ICD-10-CM | POA: Diagnosis not present

## 2023-03-15 DIAGNOSIS — E7800 Pure hypercholesterolemia, unspecified: Secondary | ICD-10-CM | POA: Diagnosis not present

## 2023-03-22 DIAGNOSIS — E7800 Pure hypercholesterolemia, unspecified: Secondary | ICD-10-CM | POA: Diagnosis not present

## 2023-03-22 DIAGNOSIS — Z79899 Other long term (current) drug therapy: Secondary | ICD-10-CM | POA: Diagnosis not present

## 2023-03-22 DIAGNOSIS — I10 Essential (primary) hypertension: Secondary | ICD-10-CM | POA: Diagnosis not present

## 2023-03-22 DIAGNOSIS — R972 Elevated prostate specific antigen [PSA]: Secondary | ICD-10-CM | POA: Diagnosis not present

## 2023-03-22 DIAGNOSIS — Z0000 Encounter for general adult medical examination without abnormal findings: Secondary | ICD-10-CM | POA: Diagnosis not present

## 2023-03-22 DIAGNOSIS — Z125 Encounter for screening for malignant neoplasm of prostate: Secondary | ICD-10-CM | POA: Diagnosis not present

## 2023-11-04 DIAGNOSIS — H524 Presbyopia: Secondary | ICD-10-CM | POA: Diagnosis not present

## 2024-03-16 DIAGNOSIS — I10 Essential (primary) hypertension: Secondary | ICD-10-CM | POA: Diagnosis not present

## 2024-03-16 DIAGNOSIS — Z125 Encounter for screening for malignant neoplasm of prostate: Secondary | ICD-10-CM | POA: Diagnosis not present

## 2024-03-16 DIAGNOSIS — Z79899 Other long term (current) drug therapy: Secondary | ICD-10-CM | POA: Diagnosis not present

## 2024-03-16 DIAGNOSIS — E7800 Pure hypercholesterolemia, unspecified: Secondary | ICD-10-CM | POA: Diagnosis not present

## 2024-03-24 ENCOUNTER — Other Ambulatory Visit: Payer: Self-pay

## 2024-03-24 DIAGNOSIS — Z0000 Encounter for general adult medical examination without abnormal findings: Secondary | ICD-10-CM | POA: Diagnosis not present

## 2024-03-24 DIAGNOSIS — Z79899 Other long term (current) drug therapy: Secondary | ICD-10-CM | POA: Diagnosis not present

## 2024-03-24 DIAGNOSIS — E7800 Pure hypercholesterolemia, unspecified: Secondary | ICD-10-CM | POA: Diagnosis not present

## 2024-03-24 DIAGNOSIS — Z125 Encounter for screening for malignant neoplasm of prostate: Secondary | ICD-10-CM | POA: Diagnosis not present

## 2024-03-24 DIAGNOSIS — I10 Essential (primary) hypertension: Secondary | ICD-10-CM | POA: Diagnosis not present

## 2024-03-24 DIAGNOSIS — Z1331 Encounter for screening for depression: Secondary | ICD-10-CM | POA: Diagnosis not present
